# Patient Record
Sex: Male | Born: 1955 | Race: White | Hispanic: No | Marital: Single | Smoking: Current some day smoker
Health system: Southern US, Community
[De-identification: ages and names within clinical notes are randomized; demographics above are authoritative.]

## PROBLEM LIST (undated history)

## (undated) DIAGNOSIS — K219 Gastro-esophageal reflux disease without esophagitis: Secondary | ICD-10-CM

## (undated) DIAGNOSIS — E785 Hyperlipidemia, unspecified: Secondary | ICD-10-CM

## (undated) DIAGNOSIS — J449 Chronic obstructive pulmonary disease, unspecified: Secondary | ICD-10-CM

## (undated) DIAGNOSIS — N138 Other obstructive and reflux uropathy: Secondary | ICD-10-CM

## (undated) DIAGNOSIS — N401 Enlarged prostate with lower urinary tract symptoms: Secondary | ICD-10-CM

## (undated) DIAGNOSIS — N529 Male erectile dysfunction, unspecified: Secondary | ICD-10-CM

## (undated) DIAGNOSIS — M722 Plantar fascial fibromatosis: Secondary | ICD-10-CM

## (undated) DIAGNOSIS — J45909 Unspecified asthma, uncomplicated: Secondary | ICD-10-CM

## (undated) DIAGNOSIS — I10 Essential (primary) hypertension: Secondary | ICD-10-CM

## (undated) DIAGNOSIS — G8929 Other chronic pain: Secondary | ICD-10-CM

## (undated) HISTORY — PX: FOOT SURGERY: SHX648

## (undated) HISTORY — DX: Plantar fascial fibromatosis: M72.2

## (undated) HISTORY — PX: TONSILLECTOMY: SUR1361

## (undated) HISTORY — PX: TOE AMPUTATION: SHX809

## (undated) HISTORY — DX: Male erectile dysfunction, unspecified: N52.9

## (undated) HISTORY — PX: BACK SURGERY: SHX140

## (undated) HISTORY — DX: Chronic obstructive pulmonary disease, unspecified: J44.9

## (undated) HISTORY — DX: Benign prostatic hyperplasia with lower urinary tract symptoms: N40.1

## (undated) HISTORY — PX: FRACTURE SURGERY: SHX138

## (undated) HISTORY — DX: Other obstructive and reflux uropathy: N13.8

## (undated) HISTORY — DX: Unspecified asthma, uncomplicated: J45.909

## (undated) HISTORY — DX: Gastro-esophageal reflux disease without esophagitis: K21.9

## (undated) HISTORY — DX: Other chronic pain: G89.29

## (undated) HISTORY — DX: Hyperlipidemia, unspecified: E78.5

## (undated) HISTORY — DX: Essential (primary) hypertension: I10

## (undated) HISTORY — PX: SKIN GRAFT: SHX250

---

## 1984-03-28 HISTORY — PX: OTHER SURGICAL HISTORY: SHX169

## 1999-12-14 ENCOUNTER — Emergency Department (HOSPITAL_COMMUNITY): Admission: EM | Admit: 1999-12-14 | Discharge: 1999-12-14 | Payer: Self-pay | Admitting: Emergency Medicine

## 2017-10-09 ENCOUNTER — Encounter: Payer: Self-pay | Admitting: Gastroenterology

## 2017-10-09 HISTORY — PX: ESOPHAGOGASTRODUODENOSCOPY: SHX1529

## 2017-10-09 HISTORY — PX: COLONOSCOPY: SHX174

## 2017-10-23 HISTORY — PX: COLONOSCOPY: SHX174

## 2018-03-28 HISTORY — PX: COLONOSCOPY: SHX174

## 2018-06-14 ENCOUNTER — Telehealth: Payer: Self-pay | Admitting: Gastroenterology

## 2018-06-14 NOTE — Telephone Encounter (Signed)
Hi Dr. Lyndel Safe, we have received a referral from pt's PCP for abdominal pain and swelling. Pt has been seeing a GI doctor at [facility] but is looking to transfer his care over to you because he has not be very pleased with the care received. We have received his GI records and they will be sent over for your review. Please advise on scheduling. Thank you.

## 2018-06-14 NOTE — Telephone Encounter (Signed)
Will be glad to help OK to schedule in 4 weeks or longer until Covid scare is over [employer]

## 2018-06-15 ENCOUNTER — Encounter: Payer: Self-pay | Admitting: Gastroenterology

## 2018-07-09 ENCOUNTER — Encounter: Payer: Self-pay | Admitting: Gastroenterology

## 2018-07-10 ENCOUNTER — Ambulatory Visit: Payer: Self-pay | Admitting: Gastroenterology

## 2018-08-17 ENCOUNTER — Telehealth: Payer: Self-pay | Admitting: Gastroenterology

## 2018-08-17 NOTE — Telephone Encounter (Signed)
The pt called to confirm appt with Dr Lyndel Safe was a virtual visit. I did advise him of the date and time and that it is virtual The pt has been advised of the information and verbalized understanding.

## 2018-08-21 ENCOUNTER — Encounter: Payer: Self-pay | Admitting: Gastroenterology

## 2018-08-21 ENCOUNTER — Other Ambulatory Visit: Payer: Self-pay

## 2018-08-21 ENCOUNTER — Telehealth (INDEPENDENT_AMBULATORY_CARE_PROVIDER_SITE_OTHER): Payer: Medicare Other | Admitting: Gastroenterology

## 2018-08-21 VITALS — Ht 68.0 in | Wt 184.0 lb

## 2018-08-21 DIAGNOSIS — M6208 Separation of muscle (nontraumatic), other site: Secondary | ICD-10-CM

## 2018-08-21 DIAGNOSIS — R109 Unspecified abdominal pain: Secondary | ICD-10-CM | POA: Diagnosis not present

## 2018-08-21 DIAGNOSIS — K5909 Other constipation: Secondary | ICD-10-CM

## 2018-08-21 DIAGNOSIS — R140 Abdominal distension (gaseous): Secondary | ICD-10-CM

## 2018-08-21 MED ORDER — NALOXEGOL OXALATE 25 MG PO TABS: 25.0000 mg | ORAL_TABLET | Freq: Every day | ORAL | 0 refills | Status: DC

## 2018-08-21 MED ORDER — SUPREP BOWEL PREP KIT 17.5-3.13-1.6 GM/177ML PO SOLN: 1.0000 | ORAL | 0 refills | Status: DC

## 2018-08-21 NOTE — Patient Instructions (Addendum)
If you are age 63 or older, your body mass index should be between 23-30. Your Body mass index is 27.98 kg/m. If this is out of the aforementioned range listed, please consider follow up with your Primary Care Provider.  If you are age 43 or younger, your body mass index should be between 19-25. Your Body mass index is 27.98 kg/m. If this is out of the aformentioned range listed, please consider follow up with your Primary Care Provider.   We have given you samples of the following medication to take: Movantik  We have sent the following medications to your pharmacy for you to pick up at your convenience: Suprep  Please purchase the following medications over the counter and take as directed: Miralax 17 grams twice daily.  You have been scheduled [facility] [place] [address] [address]   You are scheduled on 08/24/18 at [place] should arrive 15 minutes prior to your appointment time for registration. Please follow the written instructions below on the day of your exam:  WARNING: IF YOU ARE ALLERGIC TO IODINE/X-RAY DYE, PLEASE NOTIFY RADIOLOGY IMMEDIATELY AT [phone]! YOU WILL BE GIVEN A 13 HOUR PREMEDICATION PREP.  1) Do not eat or drink anything after 5am (4 hours prior to your test) 2) You have been given 2 bottles of oral contrast to drink. The solution may taste better if refrigerated, but do NOT add ice or any other liquid to this solution. Shake well before drinking.    Drink 1 bottle of contrast @ 7am (2 hours prior to your exam)  Drink 1 bottle of contrast @ 8am (1 hour prior to your exam)  You may take any medications as prescribed with a small amount of water, if necessary. If you take any of the following medications: METFORMIN, GLUCOPHAGE, GLUCOVANCE, AVANDAMET, RIOMET, FORTAMET, Sampson MET, JANUMET, GLUMETZA or METAGLIP, you MAY be asked to HOLD this medication 48 hours AFTER the  exam.  The purpose of you drinking the oral contrast is to aid in the visualization of your intestinal tract. The contrast solution may cause some diarrhea. Depending on your individual set of symptoms, you may also receive an intravenous injection of x-ray contrast/dye. Plan on being at [facility] for 30 minutes or longer, depending on the type of exam you are having performed.  This test typically takes 30-45 minutes to complete.  If you have any questions regarding your exam or if you need to reschedule, you may call the CT department at [phone] between the hours of 8:00 am and 5:00 pm, Monday-Friday.  ________________________________________________________________________   Your provider has requested that you have lab work. Please go to [facility] in [place] ([place] to have this done.  Press "B" on the elevator. The lab is located at the first door on the left as you exit the elevator.  You have been scheduled for a colonoscopy. Please follow written instructions given to you at your visit today.  Please pick up your prep supplies at the pharmacy within the next 1-3 days. If you use inhalers (even only as needed), please bring them with you on the day of your procedure. Your physician has requested that you go to www.startemmi.com and enter the access code given to you at your visit today. This web site gives a general overview about your procedure. However, you should still follow specific instructions given to you by  our office regarding your preparation for the procedure.  To help prevent the possible spread of infection to our patients, communities, and staff; we will be implementing the following measures:  As of now we are not allowing any visitors/family members to accompany you to any upcoming appointments with [facility]. If you have any concerns about this please contact our office to discuss prior to the appointment.   Two  days before your procedure: Mix 3 packs (or capfuls) of Miralax in 48 ounces of clear liquid and drink at 6pm.   Thank you,  Dr. Jackquline Denmark

## 2018-08-21 NOTE — Progress Notes (Signed)
Chief Complaint: Abdominal pain  Referring Provider:  Raelyn Number, MD      ASSESSMENT AND PLAN;   #1. Left sided abdo pain with bloating #2. Chronic constipation (element of OIC) #3. Abn CT colography 10/2017 showing stool vs polyps ascending colon.. Had 2 attempts at colon at [facility] 10/10/2018, 10/24/2018 - poor prep, which prompted CT colography)  #4. Comorbid conditions include COPD with continued smoking, ILD, PVD, PAD, Chronic LBP on narcotics. HTN, peripheral neuropathy. #5. 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  Small B/L inguinal hernias. Eval by Sx (Dr Raul Del at [facility])  Plan: - CT abdo/pelvis ASAP. - Miralax 17g po bid (increased dose). - CBC, CMP, TSH. - Minimize pain medications as much as possible. - Increase water intake. - Movantik 25mg  po qd (samples to be given) -Therafter, colon with 2 day prep at [facility], clear liquids x 48hr prior.  I have explained the risks and benefits.  He wishes to proceed.   HPI:    Tyler Cabrera is a 63 y.o. male  With " severe abdominal pain" " I am severely blocked" in the left upper and left lower quadrant associated with abdominal bloating.  No bowel movements for the last 48 hours.  Has history of severe longstanding constipation despite of MiraLAX once a day.  Has tried multiple medications in the past.  Movantik did work but he ran out of samples.  Describes pain as colicky, 2-99/24, intermittent, severe, nonradiating.  Had extensive GI work-up at [facility] by Dr.Badreddine including EGD 10/09/2017: Negative, status post dilatation 17 mm savory.  Negative esophageal biopsies for EOE, negative small bowel biopsies for celiac.  Colonoscopy 10/09/2017: Poor preparation.  Repeat colonoscopy 10/23/2017-small colonic polyp status post polypectomy in the transverse colon, internal hemorrhoids.  Poor preparation.  CT colonography-stool versus polyps ascending colon.  CT 08/07/2017-negative.  Had surgical consultation for  possible ventral hernia.  Diagnosed with rectal diastases.  Small bilateral inguinal hernias which do not need surgical repair.  At the present time he denies having any nausea/vomiting.  Denies having any heartburn, odynophagia or dysphagia.  No significant weight loss.  Continues to require pain medications.  Has appointment with Dr. Jannette Fogo tomorrow.  Continues to smoke despite of medical advice.  No sodas, chocolates, chewing gums and candy.   Past Medical History:  Diagnosis Date  . BPH with obstruction/lower urinary tract symptoms   . GERD (gastroesophageal reflux disease)   . Hyperlipidemia   . Hypertension   . Organic impotence   . Plantar fasciitis, left     Past Surgical History:  Procedure Laterality Date  . BACK SURGERY    . COLONOSCOPY  10/09/2017   [facility]-Internal hemorrhoids. Quality of prep was poor  . COLONOSCOPY  10/23/2017   [facility]-Polyp (66mm) in the transverse colon (polypectomy). Internal hemorrhoids  . COLONOSCOPY  03/28/2018   CT Virtual Colonoscopy  . ESOPHAGOGASTRODUODENOSCOPY  10/09/2017   [facility] [place] and Z-line were at 40cm. Normal mucosa in the whole esophagus (biopsy, dilation). Normal mucosa in the whole stomach  . FOOT SURGERY Left   . FRACTURE SURGERY    . SKIN GRAFT    . TOE AMPUTATION    . TONSILLECTOMY      Family History  Problem Relation Age of Onset  . Colon cancer Neg Hx   . Esophageal cancer Neg Hx   . Crohn's disease Neg Hx     Social History   Tobacco  Use  . Smoking status: Current Some Day Smoker  Substance Use Topics  . Alcohol use: Not Currently  . Drug use: Not Currently    Comment: quit 30 years ago    Current Outpatient Medications  Medication Sig Dispense Refill  . alfuzosin (UROXATRAL) 10 MG 24 hr tablet Take 1 tablet by mouth daily.    . Aspirin (ACETYL SALICYLIC ACID) POWD Take 81 mg by mouth daily.    Marland Kitchen atorvastatin (LIPITOR) 10 MG  tablet Take 1 tablet by mouth daily.    . benazepril-hydrochlorthiazide (LOTENSIN HCT) 20-25 MG tablet Take 1 tablet by mouth daily.    . cyclobenzaprine (FLEXERIL) 10 MG tablet Take 10 mg by mouth 3 (three) times daily as needed.    Marland Kitchen esomeprazole (NEXIUM) 40 MG capsule Take 1 capsule by mouth daily.    . Folic Acid 20 MG CAPS Take 1 capsule by mouth daily.    Marland Kitchen gabapentin (NEURONTIN) 800 MG tablet Take 800 mg by mouth 4 (four) times daily.    . hydrOXYzine (ATARAX/VISTARIL) 25 MG tablet 2 tablets as needed.    Marland Kitchen KLOR-CON M20 20 MEQ tablet Take 20 mEq by mouth 2 (two) times daily.    . nicotine (NICOTROL) 10 MG inhaler Inhale 1 continuous puffing into the lungs as needed for smoking cessation.    Marland Kitchen oxyCODONE-acetaminophen (PERCOCET) 10-325 MG tablet Take 1 tablet by mouth 5 (five) times daily.    . Vitamin D, Ergocalciferol, (DRISDOL) 1.25 MG (50000 UT) CAPS capsule 1 capsule once a week.    . zolpidem (AMBIEN) 10 MG tablet Take 1 tablet by mouth daily.     No current facility-administered medications for this visit.     Allergies  Allergen Reactions  . Penicillins Anaphylaxis and Other (See Comments)    Other reaction(s): Anaphylaxis   . Meperidine Nausea And Vomiting    Other reaction(s): Nausea And Vomiting Other reaction(s): Nausea And Vomiting Other reaction(s): Other headaches     Review of Systems:  Constitutional: Denies fever, chills, diaphoresis, appetite change and fatigue.  HEENT: Denies photophobia, eye pain, redness, hearing loss, ear pain, congestion, sore throat, rhinorrhea, sneezing, mouth sores, neck pain, neck stiffness and tinnitus.   Respiratory: has SOB, DOE, cough, chest tightness,  and wheezing.   Cardiovascular: Denies chest pain, palpitations and leg swelling.  Genitourinary: Denies dysuria, urgency, has frequency, no hematuria, flank pain and has occ difficulty urinating.  Musculoskeletal: has  myalgias, back pain, joint swelling, arthralgias and gait  problem.  Skin: No rash.  Neurological: Denies dizziness, seizures, syncope, weakness, light-headedness, numbness and headaches.  Hematological: Denies adenopathy. Easy bruising, personal or family bleeding history  Psychiatric/Behavioral: has anxiety or depression     Physical Exam:    Ht 5\' 8"  (1.727 m)   Wt 184 lb (83.5 kg)   BMI 27.98 kg/m  Filed Weights   08/21/18 0904  Weight: 184 lb (83.5 kg)   Not examined since it was a tele-visit.  Data Reviewed: I have personally reviewed following labs and imaging studies  This service was provided via telemedicine.  The patient was located at home.  The provider was located in office.  The patient did consent to this telephone visit and is aware of possible charges through their insurance for this visit.  The patient was referred by Dr. Jannette Fogo.   Time spent on call/review of previous extensive records/CTs: 30 min    Carmell Austria, MD 08/21/2018, 11:16 AM  Cc: Raelyn Number, MD

## 2018-08-24 ENCOUNTER — Encounter (HOSPITAL_BASED_OUTPATIENT_CLINIC_OR_DEPARTMENT_OTHER): Payer: Self-pay

## 2018-08-24 ENCOUNTER — Ambulatory Visit (HOSPITAL_BASED_OUTPATIENT_CLINIC_OR_DEPARTMENT_OTHER)
Admission: RE | Admit: 2018-08-24 | Discharge: 2018-08-24 | Disposition: A | Discharge status: Home / Self Care | Payer: Medicare Other | Source: Ambulatory Visit | Attending: Gastroenterology | Admitting: Gastroenterology

## 2018-08-24 ENCOUNTER — Other Ambulatory Visit: Payer: Self-pay

## 2018-08-24 DIAGNOSIS — R109 Unspecified abdominal pain: Secondary | ICD-10-CM | POA: Diagnosis not present

## 2018-08-24 DIAGNOSIS — R140 Abdominal distension (gaseous): Secondary | ICD-10-CM | POA: Insufficient documentation

## 2018-08-24 DIAGNOSIS — K5909 Other constipation: Secondary | ICD-10-CM | POA: Insufficient documentation

## 2018-08-24 DIAGNOSIS — M6208 Separation of muscle (nontraumatic), other site: Secondary | ICD-10-CM | POA: Diagnosis present

## 2018-08-24 MED ORDER — IOHEXOL 300 MG/ML  SOLN
100.0000 mL | Freq: Once | INTRAMUSCULAR | Status: AC | PRN
  Administered 2018-08-24: 10:00:00 100 mL via INTRAVENOUS

## 2018-08-29 ENCOUNTER — Telehealth: Payer: Self-pay | Admitting: *Deleted

## 2018-08-29 NOTE — Telephone Encounter (Signed)

## 2018-08-30 ENCOUNTER — Telehealth: Payer: Self-pay | Admitting: Gastroenterology

## 2018-08-30 NOTE — Telephone Encounter (Signed)
Patient wants to add his aunt to his DPR. I advised him to tell the front desk at check in tomorrow and we can add her.

## 2018-08-31 ENCOUNTER — Ambulatory Visit (AMBULATORY_SURGERY_CENTER): Payer: Medicare Other | Admitting: Gastroenterology

## 2018-08-31 ENCOUNTER — Encounter: Payer: Self-pay | Admitting: Gastroenterology

## 2018-08-31 ENCOUNTER — Other Ambulatory Visit: Payer: Self-pay

## 2018-08-31 VITALS — BP 119/60 | HR 72 | Temp 97.6°F | Resp 20 | Ht 68.0 in | Wt 184.0 lb

## 2018-08-31 DIAGNOSIS — D125 Benign neoplasm of sigmoid colon: Secondary | ICD-10-CM | POA: Diagnosis not present

## 2018-08-31 DIAGNOSIS — R109 Unspecified abdominal pain: Secondary | ICD-10-CM

## 2018-08-31 DIAGNOSIS — K5730 Diverticulosis of large intestine without perforation or abscess without bleeding: Secondary | ICD-10-CM

## 2018-08-31 DIAGNOSIS — D123 Benign neoplasm of transverse colon: Secondary | ICD-10-CM

## 2018-08-31 MED ORDER — SODIUM CHLORIDE 0.9 % IV SOLN: 500.0000 mL | Freq: Once | INTRAVENOUS | Status: DC

## 2018-08-31 NOTE — Progress Notes (Signed)
To PACU, VSS. Report to RN.tb 

## 2018-08-31 NOTE — Op Note (Signed)
[name] Procedure Date: 08/31/2018 1:34 PM MRN: [id number] Endoscopist: Jackquline Denmark , MD Age: 63 Referring MD:  Date of Birth: 1955-12-03 Gender: Male Account #: [id number] Procedure:                Colonoscopy Indications:              Abnormal virtual colonoscopy, LLQ pain, chronic                            constipation Medicines:                Monitored Anesthesia Care Procedure:                Pre-Anesthesia Assessment:                           - Prior to the procedure, a History and Physical                            was performed, and patient medications and                            allergies were reviewed. The patient's tolerance of                            previous anesthesia was also reviewed. The risks                            and benefits of the procedure and the sedation                            options and risks were discussed with the patient.                            All questions were answered, and informed consent                            was obtained. Prior Anticoagulants: The patient has                            taken no previous anticoagulant or antiplatelet                            agents. ASA Grade Assessment: III - A patient with                            severe systemic disease. After reviewing the risks                            and benefits, the patient was deemed in                            satisfactory condition to undergo the procedure.  After obtaining informed consent, the colonoscope                            was passed under direct vision. Throughout the                            procedure, the patient's blood pressure, pulse, and                            oxygen saturations were monitored continuously. The                            Model CF-HQ190L [id number]) scope was introduced                            through the and advanced to the 1 cm into the                        ileum. The colonoscopy was somewhat difficult due                            to a redundant colon and a tortuous colon.                            Successful completion of the procedure was aided by                            applying abdominal pressure. The quality of the                            bowel preparation was adequate to identify polyps.                            Some retained stool and solid vegetable material                            which could not be fully washed. Overall over 95%                            of the colonic mucosa was visualized satisfactorily                            despite 2-day preparation. The examination was                            adequate. The ileocecal valve, appendiceal orifice,                            and rectum were photographed. Scope In: 2:06:13 PM Scope Out: 2:31:33 PM Scope Withdrawal Time: 0 hours 14 minutes 57 seconds  Total Procedure Duration: 0 hours 25 minutes 20 seconds  Findings:                 A single (solitary) 10 mm superficial ulcer was  found in the cecum. No bleeding was present. No                            stigmata of recent bleeding were seen. Biopsies                            were taken with a cold forceps for histology.                            Estimated blood loss: none.                           A 6 mm polyp was found in the mid transverse colon.                            The polyp was sessile. The polyp was removed with a                            cold snare. Resection and retrieval were complete.                            Estimated blood loss: none.                           A 10 mm polyp was found in the proximal sigmoid                            colon. The polyp was sessile. The polyp was removed                            with a cold snare. Resection and retrieval were                            complete. Estimated blood loss: none.                            A few small-mouthed diverticula were found in the                            sigmoid colon, transverse colon and ascending colon.                           The terminal ileum appeared normal.                           The exam was otherwise without abnormality on                            direct and retroflexion views. Complications:            No immediate complications. Estimated Blood Loss:     Estimated blood loss: none. Impression:               - A single (solitary), likely stercoral ulcer in  the cecum. Biopsied.                           - Colonic polyps s/p polypectomy.                           - Mild pancolonic diverticulosis.                           - Otherwise normal to TI. Recommendation:           - Patient has a contact number available for                            emergencies. The signs and symptoms of potential                            delayed complications were discussed with the                            patient. Return to normal activities tomorrow.                            Written discharge instructions were provided to the                            patient.                           - Resume previous diet.                           - Continue present medications.                           - Miralax 1 capful (17 grams) in 8 ounces of water                            PO BID indefinitely.                           - Minimize pain medications.                           - Repeat colonoscopy for surveillance based on                            pathology results.                           - Return to GI office in 12 weeks. Jackquline Denmark, MD 08/31/2018 2:49:50 PM This report has been signed electronically.

## 2018-08-31 NOTE — Patient Instructions (Addendum)
YOU HAD AN ENDOSCOPIC PROCEDURE TODAY AT [facility]:   Refer to the procedure report that was given to you for any specific questions about what was found during the examination.  If the procedure report does not answer your questions, please call your gastroenterologist to clarify.  If you requested that your care partner not be given the details of your procedure findings, then the procedure report has been included in a sealed envelope for you to review at your convenience later.  YOU SHOULD EXPECT: Some feelings of bloating in the abdomen. Passage of more gas than usual.  Walking can help get rid of the air that was put into your GI tract during the procedure and reduce the bloating. If you had a lower endoscopy (such as a colonoscopy or flexible sigmoidoscopy) you may notice spotting of blood in your stool or on the toilet paper. If you underwent a bowel prep for your procedure, you may not have a normal bowel movement for a few days.  Please Note:  You might notice some irritation and congestion in your nose or some drainage.  This is from the oxygen used during your procedure.  There is no need for concern and it should clear up in a day or so.  SYMPTOMS TO REPORT IMMEDIATELY:   Following lower endoscopy (colonoscopy or flexible sigmoidoscopy):  Excessive amounts of blood in the stool  Significant tenderness or worsening of abdominal pains  Swelling of the abdomen that is new, acute  Fever of 100F or higher   For urgent or emergent issues, a gastroenterologist can be reached at any hour by calling [phone].   DIET:  We do recommend a small meal at first, but then you may proceed to your regular diet.  Drink plenty of fluids but you should avoid alcoholic beverages for 24 hours.  MEDICATIONS: Continue present medications. Use Mirilax 1 capful (17 grams) in 8 ounces of water by mouth twice daily indefinitely. Minimize pain medications.  Follow Up: Follow up with  Dr. Lyndel Safe in his office for an appointment in 12 weeks.  Please see handouts given to you by your recovery nurse.  ACTIVITY:  You should plan to take it easy for the rest of today and you should NOT DRIVE or use heavy machinery until tomorrow (because of the sedation medicines used during the test).    FOLLOW UP: Our staff will call the number listed on your records 48-72 hours following your procedure to check on you and address any questions or concerns that you may have regarding the information given to you following your procedure. If we do not reach you, we will leave a message.  We will attempt to reach you two times.  During this call, we will ask if you have developed any symptoms of COVID 19. If you develop any symptoms (ie: fever, flu-like symptoms, shortness of breath, cough etc.) before then, please call [phone].  If you test positive for Covid 19 in the 2 weeks post procedure, please call and report this information to [place].    If any biopsies were taken you will be contacted by phone or by letter within the next 1-3 weeks.  Please call us at [phone] if you have not heard about the biopsies in 3 weeks.   Thank you for allowing [place] to provide for your healthcare needs today.   SIGNATURES/CONFIDENTIALITY: You and/or your care partner have signed paperwork which will be entered into your electronic medical record.  These signatures attest to the fact that that the information above on your After Visit Summary has been reviewed and is understood.  Full responsibility of the confidentiality of this discharge information lies with you and/or your care-partner.

## 2018-09-04 ENCOUNTER — Telehealth: Payer: Self-pay | Admitting: *Deleted

## 2018-09-04 NOTE — Telephone Encounter (Signed)
  Follow up Call-  Call back number 08/31/2018  Post procedure Call Back phone  # [phone]  Permission to leave phone message Yes  Some recent data might be hidden     Patient questions:  Do you have a fever, pain , or abdominal swelling? No. Pain Score  0 *  Have you tolerated food without any problems? Yes.  Patient stated he hasn't had a lot to eat but that was normal for him.   Have you been able to return to your normal activities? Yes.    Do you have any questions about your discharge instructions: Diet   No. Medications  No. Follow up visit  No.  Do you have questions or concerns about your Care? No.  Actions: * If pain score is 4 or above: No action needed, pain <4.  1. Have you developed a fever since your procedure? NO  2.   Have you had an respiratory symptoms (SOB or cough) since your procedure? NO  3.   Have you tested positive for COVID 19 since your procedure NO  4.   Have you had any family members/close contacts diagnosed with the COVID 19 since your procedure?  NO   If yes to any of these questions please route to Joylene John, RN and Alphonsa Gin, RN.

## 2018-09-05 ENCOUNTER — Encounter: Payer: Self-pay | Admitting: Gastroenterology

## 2020-01-15 IMAGING — CT CT ABDOMEN AND PELVIS WITH CONTRAST
2 of 5 series · 16 of 46 positions shown, 18 images · IV contrast (APPLIED)
Comparison: CT the abdomen and pelvis 04/03/2018.

CLINICAL DATA: 62-year-old male with history of left-sided
abdominal pain with distention. Pain worsening over the past several
months. Constipation.

EXAM:
CT ABDOMEN AND PELVIS WITH CONTRAST
TECHNIQUE: Multidetector CT imaging of the abdomen and pelvis was performed
using the standard protocol following bolus administration of
intravenous contrast.
CONTRAST:  100mL OMNIPAQUE IOHEXOL 300 MG/ML  SOLN

[Series 2: axial st · axial · 0.83mm/px · z∈[-492,-42]mm · 13 of 102 slices shown, 15 images]
[im 6/102  soft-tissue]
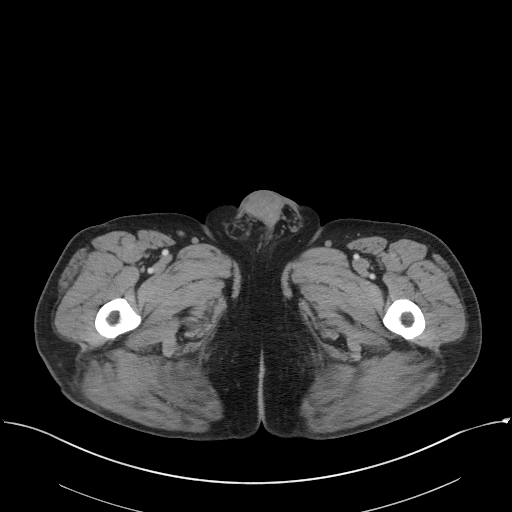
[im 6/102  bone]
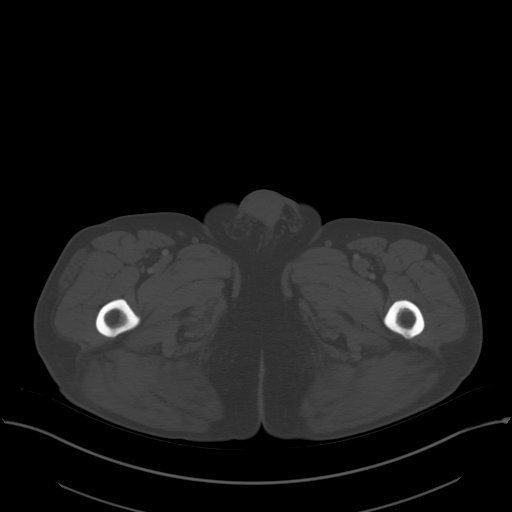
[im 12/102  soft-tissue]
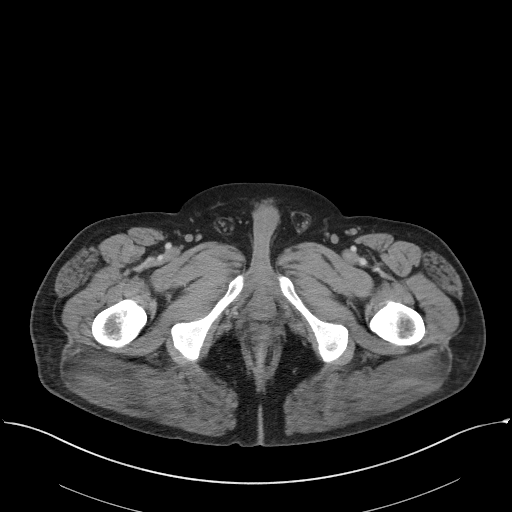
[im 23/102  soft-tissue]
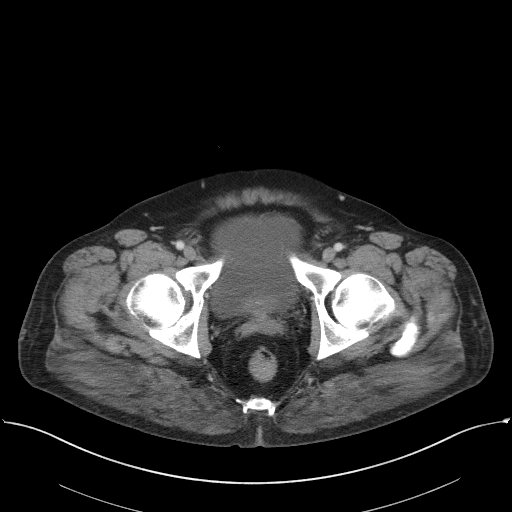
[im 29/102  soft-tissue]
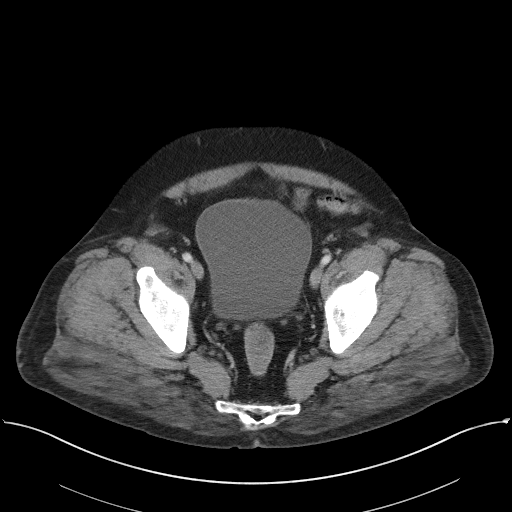
[im 34/102  soft-tissue]
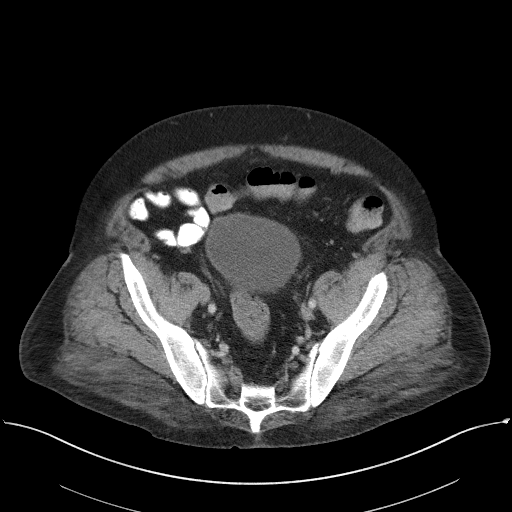
[im 45/102  soft-tissue]
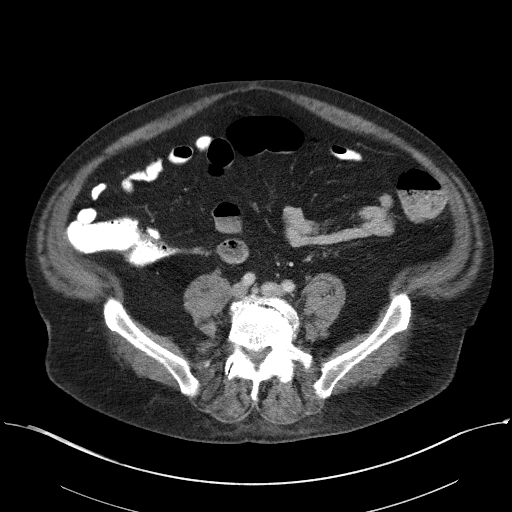
[im 51/102  soft-tissue]
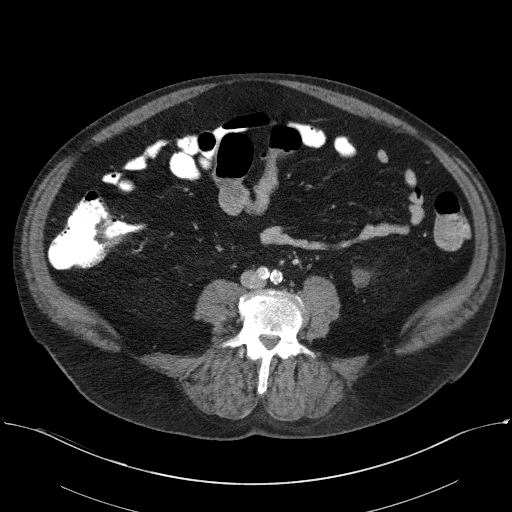
[im 57/102  soft-tissue]
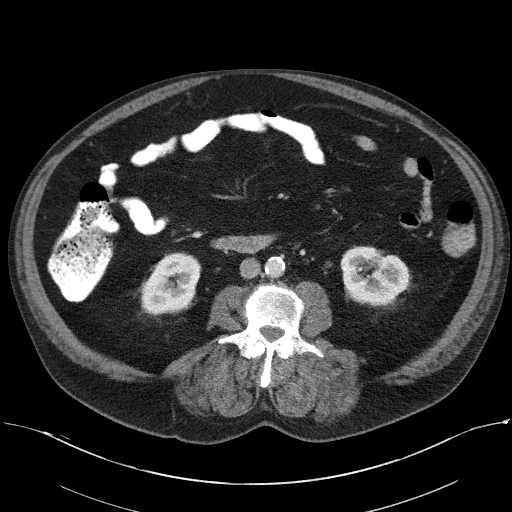
[im 68/102  soft-tissue]
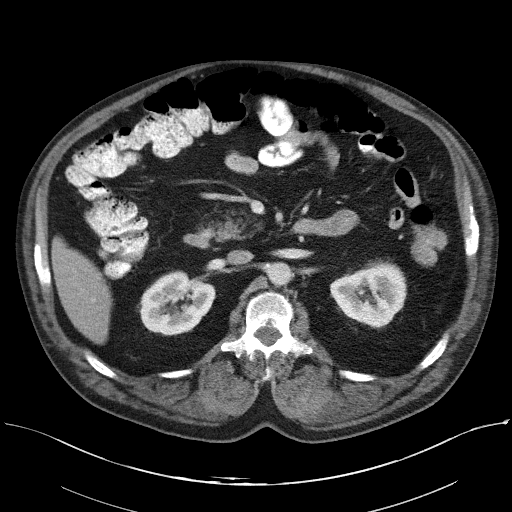
[im 68/102  bone]
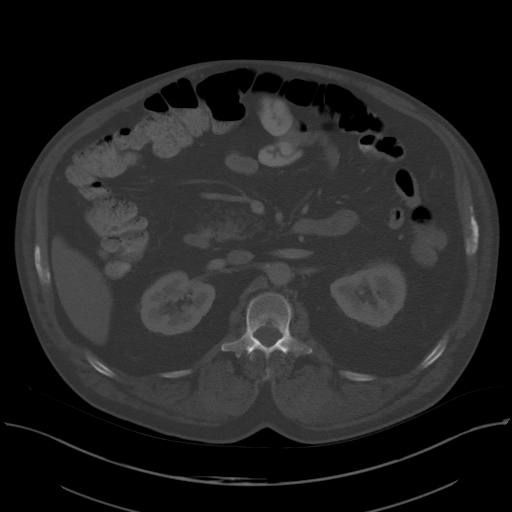
[im 73/102  soft-tissue]
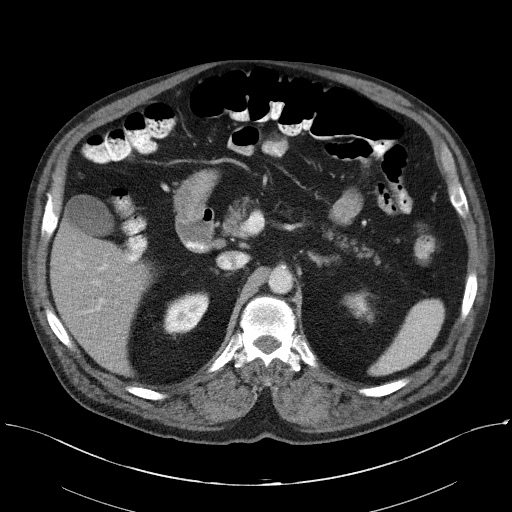
[im 79/102  soft-tissue]
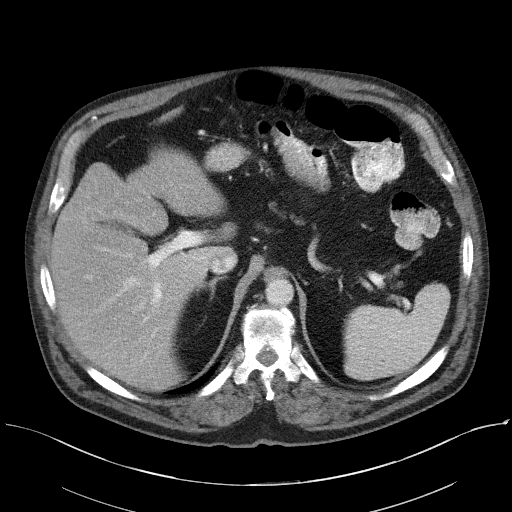
[im 90/102  soft-tissue]
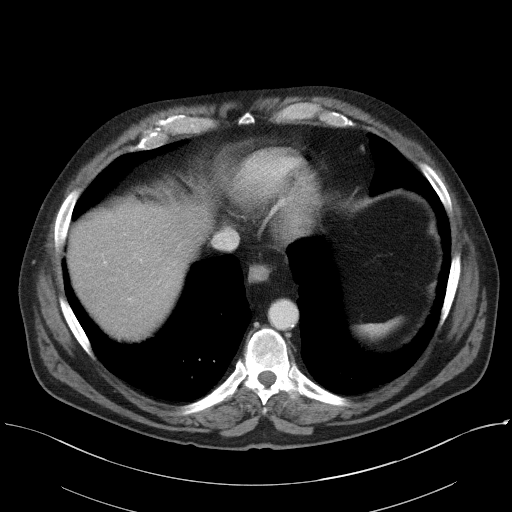
[im 96/102  soft-tissue]
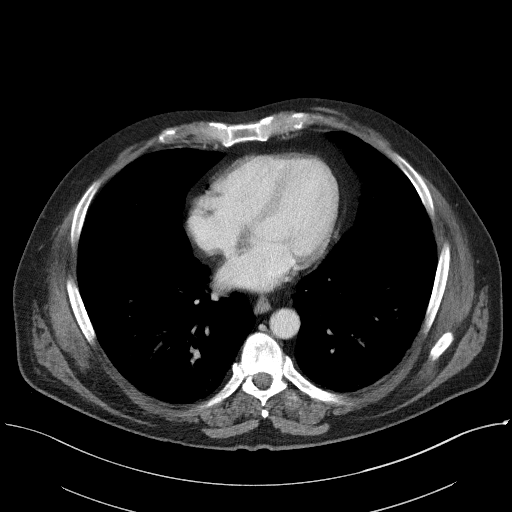

[Series 5: coronal st · coronal · 1.07mm/px · 3 of 109 slices shown]
[im 37/109  soft-tissue]
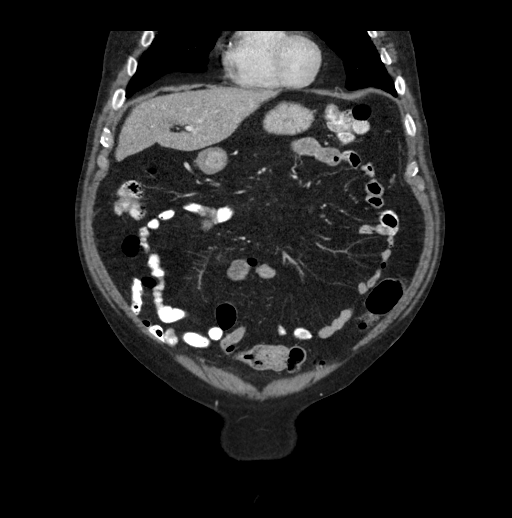
[im 49/109  soft-tissue]
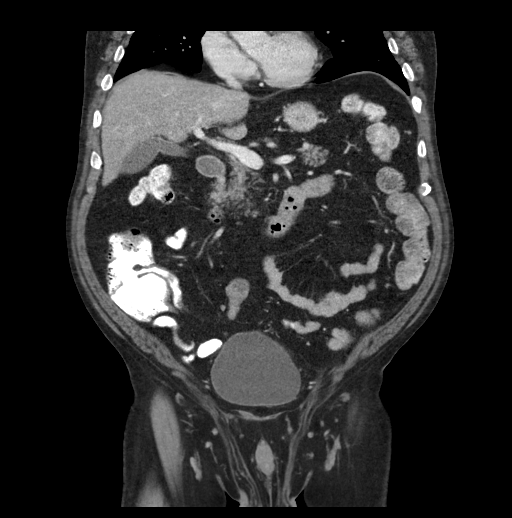
[im 61/109  soft-tissue]
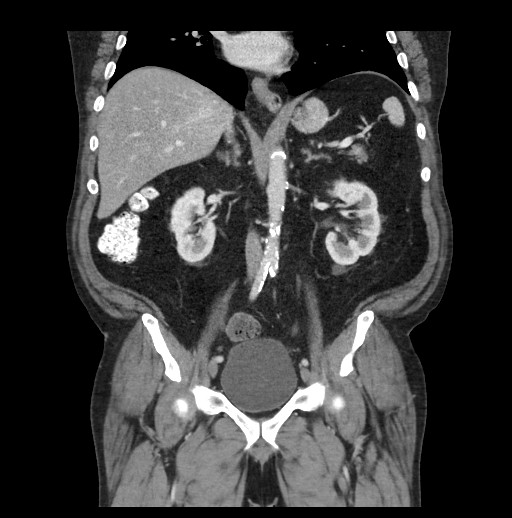

[16 of 46 positions shown; findings below may reference images not displayed]

FINDINGS: Lower chest: Emphysematous changes in the lung bases bilaterally.
Atherosclerotic calcifications in the descending thoracic aorta as
well as the left circumflex and right coronary arteries.
Calcifications of the mitral annulus.

Hepatobiliary: No suspicious cystic or solid hepatic lesions. No
intra or extrahepatic biliary ductal dilatation. Gallbladder is
normal in appearance.

Pancreas: No pancreatic mass. No pancreatic ductal dilatation. No
pancreatic or peripancreatic fluid or inflammatory changes.

Spleen: Unremarkable.

Adrenals/Urinary Tract: Exophytic low-attenuation lesion in the
lower pole of the left kidney, compatible with a simple cysts,
measuring 1.5 cm. Right kidney and bilateral adrenal glands are
normal in appearance. No hydroureteronephrosis. Urinary bladder is
normal in appearance.

Stomach/Bowel: Normal appearance of the stomach. No pathologic
dilatation of small bowel or colon. Normal appendix.

Vascular/Lymphatic: Aortic atherosclerosis, without evidence of
aneurysm or dissection in the abdominal or pelvic vasculature. No
lymphadenopathy noted in the abdomen or pelvis.

Reproductive: Prostate gland and seminal vesicles are unremarkable
in appearance.

Other: No significant volume of ascites.  No pneumoperitoneum.

Musculoskeletal: There are no aggressive appearing lytic or blastic
lesions noted in the visualized portions of the skeleton.
IMPRESSION: 1. No acute findings are noted in the abdomen or pelvis to account
for the patient's symptoms.
2. Normal appendix.
3. Emphysema.
4. Aortic atherosclerosis, in addition to 2 vessel coronary artery
disease. Please note that although the presence of coronary artery
calcium documents the presence of coronary artery disease, the
severity of this disease and any potential stenosis cannot be
assessed on this non-gated CT examination. Assessment for potential
risk factor modification, dietary therapy or pharmacologic therapy
may be warranted, if clinically indicated.
5. Additional incidental findings, as above.

## 2020-08-06 ENCOUNTER — Ambulatory Visit: Payer: Medicare Other | Admitting: Neurology

## 2020-11-05 ENCOUNTER — Encounter: Payer: Self-pay | Admitting: Neurology

## 2020-11-05 ENCOUNTER — Ambulatory Visit: Payer: Medicare Other | Admitting: Neurology

## 2020-11-05 ENCOUNTER — Encounter: Payer: Self-pay | Admitting: *Deleted

## 2022-06-23 ENCOUNTER — Encounter: Payer: Self-pay | Admitting: Internal Medicine

## 2022-11-30 ENCOUNTER — Encounter: Payer: Self-pay | Admitting: Gastroenterology

## 2022-11-30 ENCOUNTER — Telehealth: Payer: Self-pay | Admitting: Gastroenterology

## 2023-03-07 ENCOUNTER — Ambulatory Visit: Payer: 59 | Admitting: Gastroenterology

## 2023-07-26 ENCOUNTER — Encounter: Payer: Self-pay | Admitting: Gastroenterology

## 2023-08-07 ENCOUNTER — Emergency Department (HOSPITAL_COMMUNITY)

## 2023-08-07 ENCOUNTER — Other Ambulatory Visit: Payer: Self-pay

## 2023-08-07 ENCOUNTER — Emergency Department (HOSPITAL_COMMUNITY)
Admission: EM | Admit: 2023-08-07 | Discharge: 2023-08-08 | Disposition: A | Discharge status: Home / Self Care | Attending: Emergency Medicine | Admitting: Emergency Medicine

## 2023-08-07 ENCOUNTER — Telehealth: Payer: Self-pay | Admitting: Gastroenterology

## 2023-08-07 DIAGNOSIS — R1084 Generalized abdominal pain: Secondary | ICD-10-CM | POA: Diagnosis present

## 2023-08-07 DIAGNOSIS — I10 Essential (primary) hypertension: Secondary | ICD-10-CM | POA: Diagnosis not present

## 2023-08-07 DIAGNOSIS — J45909 Unspecified asthma, uncomplicated: Secondary | ICD-10-CM | POA: Insufficient documentation

## 2023-08-07 LAB — COMPREHENSIVE METABOLIC PANEL WITH GFR
ALT: 16 U/L (ref 0–44)
AST: 17 U/L (ref 15–41)
Albumin: 3.8 g/dL (ref 3.5–5.0)
Alkaline Phosphatase: 62 U/L (ref 38–126)
Anion gap: 11 (ref 5–15)
BUN: 9 mg/dL (ref 8–23)
CO2: 25 mmol/L (ref 22–32)
Calcium: 8.8 mg/dL — ABNORMAL LOW (ref 8.9–10.3)
Chloride: 101 mmol/L (ref 98–111)
Creatinine, Ser: 0.69 mg/dL (ref 0.61–1.24)
GFR, Estimated: >60 mL/min (ref >60)
Glucose, Bld: 113 mg/dL — ABNORMAL HIGH (ref 70–99)
Potassium: 3.3 mmol/L — ABNORMAL LOW (ref 3.5–5.1)
Sodium: 137 mmol/L (ref 135–145)
Total Bilirubin: 0.3 mg/dL (ref 0.0–1.2)
Total Protein: 6.6 g/dL (ref 6.5–8.1)

## 2023-08-07 LAB — CBC WITH DIFFERENTIAL/PLATELET
Abs Immature Granulocytes: 0.02 10*3/uL (ref 0.00–0.07)
Basophils Absolute: 0 10*3/uL (ref 0.0–0.1)
Basophils Relative: 0 %
Eosinophils Absolute: 0.2 10*3/uL (ref 0.0–0.5)
Eosinophils Relative: 3 %
HCT: 41.9 % (ref 39.0–52.0)
Hemoglobin: 13.9 g/dL (ref 13.0–17.0)
Immature Granulocytes: 0 %
Lymphocytes Relative: 19 %
Lymphs Abs: 1.6 10*3/uL (ref 0.7–4.0)
MCH: 31.3 pg (ref 26.0–34.0)
MCHC: 33.2 g/dL (ref 30.0–36.0)
MCV: 94.4 fL (ref 80.0–100.0)
Monocytes Absolute: 0.8 10*3/uL (ref 0.1–1.0)
Monocytes Relative: 9 %
Neutro Abs: 5.9 10*3/uL (ref 1.7–7.7)
Neutrophils Relative %: 69 %
Platelets: 324 10*3/uL (ref 150–400)
RBC: 4.44 MIL/uL (ref 4.22–5.81)
RDW: 14 % (ref 11.5–15.5)
WBC: 8.6 10*3/uL (ref 4.0–10.5)
nRBC: 0 % (ref 0.0–0.2)

## 2023-08-07 LAB — LIPASE, BLOOD: Lipase: 24 U/L (ref 11–51)

## 2023-08-07 MED ORDER — MORPHINE SULFATE (PF) 4 MG/ML IV SOLN
4.0000 mg | Freq: Once | INTRAVENOUS | Status: AC
  Administered 2023-08-08: 4 mg via INTRAVENOUS
  Filled 2023-08-07: qty 1

## 2023-08-07 MED ORDER — LIDOCAINE 5 % EX PTCH
1.0000 | MEDICATED_PATCH | CUTANEOUS | Status: DC
  Administered 2023-08-08: 1 via TRANSDERMAL
  Filled 2023-08-07: qty 1

## 2023-08-07 MED ORDER — SODIUM CHLORIDE 0.9 % IV BOLUS
500.0000 mL | Freq: Once | INTRAVENOUS | Status: AC
  Administered 2023-08-08: 500 mL via INTRAVENOUS

## 2023-08-07 MED ORDER — ONDANSETRON HCL 4 MG/2ML IJ SOLN
4.0000 mg | Freq: Once | INTRAMUSCULAR | Status: AC
  Administered 2023-08-08: 4 mg via INTRAVENOUS
  Filled 2023-08-07: qty 2

## 2023-08-07 MED ORDER — IOHEXOL 300 MG/ML  SOLN
100.0000 mL | Freq: Once | INTRAMUSCULAR | Status: AC | PRN
Start: 2023-08-07 — End: 2023-08-07
  Administered 2023-08-07: 100 mL via INTRAVENOUS

## 2023-08-07 NOTE — ED Provider Triage Note (Signed)
 Emergency Medicine Provider Triage Evaluation Note  Tyler Cabrera , a 68 y.o. male  was evaluated in triage.  Pt complains of here for abdominal pain.  Ongoing over the last month, worse over the last few days.  Diffuse to anterior abdomen wraps around his bilateral flanks.  No nausea, vomiting, loose stool.  No blood in stool.  Urinating without difficulty.  Feels generalized fatigue.  Follows with Dr. Venice Gillis with GI who recommend coming here for eval  Review of Systems  Positive: Abd pain Negative: Fever, N/V/D  Physical Exam  BP (!) 148/68   Pulse 92   Temp 97.9 F (36.6 C) (Oral)   Resp 18   SpO2 99%  Gen:   Awake, no distress   Resp:  Normal effort  MSK:   Moves extremities without difficulty  ABD:  Diffuse tenderness Other:    Medical Decision Making  Medically screening exam initiated at 5:22 PM.  Appropriate orders placed.  ANCE REDIC was informed that the remainder of the evaluation will be completed by another provider, this initial triage assessment does not replace that evaluation, and the importance of remaining in the ED until their evaluation is complete.  Abd pain   Ishi Danser A, PA-C 08/07/23 1723

## 2023-08-07 NOTE — Telephone Encounter (Signed)
 Pt stated that he has been having ongoing stomach pain for several weeks now that is getting worse and states that he is in terrible pain today. Pt stated that he feels that he is short of breath due to the abdominal pain and abdominal bloating. Last BM yesterday. BM were formed. Pt stated that he is constantly nauseated now, no emeses. Pt was notified with his current symptoms and distress the recommendations were to proceed to the ED for evaluation and treatment.  Pt was notified that typically we recommend [facility]. Pt verbalized understanding with all questions answered.

## 2023-08-07 NOTE — Telephone Encounter (Signed)
 Inbound call from patient's mother stating patient's is very bloated and very "sick". Requesting a call to discuss further and to discuss bringing patient to the hospital. Patient requested for us  to send an ambulance to their house, advised patient she would have to call 911 for patient to be brought to the hospital. Patient's mother stated she is going to have son brought to the hospital. Requesting to be seen with Dr. Venice Gillis while in the hospital. Requesting a urgent call back. Please advise, thank you.

## 2023-08-07 NOTE — ED Provider Notes (Signed)
 Tyler Cabrera Provider Note   CSN: [id number] Arrival date & time: 08/07/23  1646     History {Add pertinent medical, surgical, social history, OB history to HPI:1} Chief Complaint  Patient presents with   Abdominal Pain    Pt BIB EMS today for abd pain ongoing for 6 weeks sent here GI doc for distension and abd pain that radiates to back. VSS, sob reported    Tyler Cabrera is a 68 y.o. male.  The history is provided by the patient and medical records.  Abdominal Pain Tyler Cabrera is a 68 y.o. male who presents to the Emergency Department complaining of *** Left sided abdominal for 1.5 months. Sharp. Goes to both sides. Constant. Can't sleep or move. Sweats at night but fever.  Has cough and sob for one month. No leg swelling. Has nausea. No vomiting. No diarrhea. Has constipation. Has dysuria.  Supposed to see gupta for christmas. 4-5 months or a year Eating less.   Hx/o neuropathy, HTN. Has chronic pain - previously on percocet now on oxy 15.   Miralax and exlax    Home Medications Prior to Admission medications   Medication Sig Start Date End Date Taking? Authorizing Provider  albuterol (VENTOLIN HFA) 108 (90 Base) MCG/ACT inhaler INHALE 1 PUFF EVERY 4 HOURS AS NEEDED 09/09/20   [provider]  alfuzosin (UROXATRAL) 10 MG 24 hr tablet Take 1 tablet by mouth daily. 02/03/16   [provider]  Aspirin (ACETYL SALICYLIC ACID) POWD Take 81 mg by mouth daily.    [provider]  atorvastatin (LIPITOR) 10 MG tablet Take 1 tablet by mouth daily. 09/25/11   [provider]  benazepril-hydrochlorthiazide (LOTENSIN HCT) 20-25 MG tablet Take 1 tablet by mouth daily. 07/26/18   [provider]  cyclobenzaprine (FLEXERIL) 10 MG tablet Take 10 mg by mouth 3 (three) times daily as needed. 08/06/18   [provider]  esomeprazole (NEXIUM) 40 MG capsule Take 1 capsule by mouth daily. 09/05/11    [provider]  Folic Acid 20 MG CAPS Take 1 capsule by mouth daily.    [provider]  gabapentin (NEURONTIN) 800 MG tablet Take 800 mg by mouth 4 (four) times daily. 08/10/18   [provider]  hydrOXYzine (ATARAX/VISTARIL) 25 MG tablet 2 tablets as needed. 09/16/17   [provider]  KLOR-CON M20 20 MEQ tablet Take 20 mEq by mouth 2 (two) times daily. 06/12/18   [provider]  naloxegol  oxalate (MOVANTIK ) 25 MG TABS tablet Take 1 tablet (25 mg total) by mouth daily. 08/21/18   Lajuan Pila, MD  nicotine (NICOTROL) 10 MG inhaler Inhale 1 continuous puffing into the lungs as needed for smoking cessation.    [provider]  oxyCODONE-acetaminophen (PERCOCET) 10-325 MG tablet Take 1 tablet by mouth 5 (five) times daily. 08/31/11   [provider]  SPIRIVA HANDIHALER 18 MCG inhalation capsule 1 capsule daily. 10/11/20   [provider]  SUPREP BOWEL PREP  KIT 17.5-3.13-1.6 GM/177ML SOLN Take 1 kit by mouth as directed. 08/21/18   Lajuan Pila, MD  SYMBICORT 160-4.5 MCG/ACT inhaler SMARTSIG:2 Puff(s) By Mouth Twice Daily 09/30/20   [provider]  Vitamin D, Ergocalciferol, (DRISDOL) 1.25 MG (50000 UT) CAPS capsule 1 capsule once a week. 04/09/16   [provider]  zolpidem (AMBIEN) 10 MG tablet Take 1 tablet by mouth daily. 09/25/11   [provider]      Allergies  Penicillins, Demerol  [meperidine hcl], Meperidine, Cymbalta [duloxetine hcl], Nifedipine, and Nsaids    Review of Systems   Review of Systems  Gastrointestinal:  Positive for abdominal pain.  All other systems reviewed and are negative.   Physical Exam Updated Vital Signs BP 119/82 (BP Location: Right Arm)   Pulse (!) 51   Temp 98 F (36.7 C) (Oral)   Resp 18   SpO2 100%  Physical Exam Vitals and nursing note reviewed.  Constitutional:      Appearance: He is well-developed.  HENT:     Head: Normocephalic and atraumatic.   Cardiovascular:     Rate and Rhythm: Normal rate and regular rhythm.  Pulmonary:     Effort: Pulmonary effort is normal. No respiratory distress.  Abdominal:     Palpations: Abdomen is soft.     Tenderness: There is no guarding or rebound.     Comments: Mild generalized tenderness, greatest on the left abd and flank.   Musculoskeletal:        General: No swelling or tenderness.     Comments: 2+ DP pulses bilaterally  Skin:    General: Skin is warm and dry.  Neurological:     Mental Status: He is alert and oriented to person, place, and time.  Psychiatric:        Behavior: Behavior normal.     ED Results / Procedures / Treatments   Labs (all labs ordered are listed, but only abnormal results are displayed) Labs Reviewed  COMPREHENSIVE METABOLIC PANEL WITH GFR - Abnormal; Notable for the following components:      Result Value   Potassium 3.3 (*)    Glucose, Bld 113 (*)    Calcium 8.8 (*)    All other components within normal limits  CBC WITH DIFFERENTIAL/PLATELET  LIPASE, BLOOD  URINALYSIS, W/ REFLEX TO CULTURE (INFECTION SUSPECTED)    EKG None  Radiology CT ABDOMEN PELVIS W CONTRAST Result Date: 08/07/2023 CLINICAL DATA:  Abdomen pain EXAM: CT ABDOMEN AND PELVIS WITH CONTRAST TECHNIQUE: Multidetector CT imaging of the abdomen and pelvis was performed using the standard protocol following bolus administration of intravenous contrast. RADIATION DOSE REDUCTION: This exam was performed according to the departmental dose-optimization program which includes automated exposure control, adjustment of the mA and/or kV according to patient size and/or use of iterative reconstruction technique. CONTRAST:  OMNIPAQUE  IOHEXOL  300 MG/ML  SOLN COMPARISON:  CT 08/24/2018, 01/30/2023 FINDINGS: Lower chest: Lung bases demonstrate no consolidation or effusion. Chronic lung disease with fibrosis and mild ground-glass densities. Hepatobiliary: Contracted gallbladder. No calcified stone. No  biliary dilatation Pancreas: Unremarkable. No pancreatic ductal dilatation or surrounding inflammatory changes. Spleen: Vague slightly geographic hypodensity within the anterior and central spleen, series 2, image 11, measures about 4.8 cm, suspect probably related to phase of enhancement. Adrenals/Urinary Tract: Adrenal glands are normal. Kidneys show no hydronephrosis. Small cyst lower pole left kidney for which no imaging follow-up is recommended. Stomach/Bowel: Stomach nonenlarged. No dilated small bowel. No acute bowel wall thickening Vascular/Lymphatic: Aortic atherosclerosis. No enlarged abdominal or pelvic lymph nodes. Reproductive: Prostate is unremarkable. Other: Negative for pelvic effusion or free air Musculoskeletal: Artifact from right hip replacement. No acute osseous abnormality IMPRESSION: 1. No CT evidence for acute intra-abdominal or pelvic abnormality. 2. Findings suspect for chronic interstitial lung disease with fibrosis and mild ground-glass densities at the lung bases. Follow-up nonemergent high-resolution chest CT may be obtained if not already performed Aortic Atherosclerosis (ICD10-I70.0). Electronically Signed   By: Ulyess Gammons.D.  On: 08/07/2023 21:00    Procedures Procedures  {Document cardiac monitor, telemetry assessment procedure when appropriate:1}  Medications Ordered in ED Medications  iohexol  (OMNIPAQUE ) 300 MG/ML solution 100 mL (100 mLs Intravenous Contrast Given 08/07/23 2036)    ED Course/ Medical Decision Making/ A&P   {   Click here for ABCD2, HEART and other calculatorsREFRESH Note before signing :1}                              Medical Decision Making  ***  {Document critical care time when appropriate:1} {Document review of labs and clinical decision tools ie heart score, Chads2Vasc2 etc:1}  {Document your independent review of radiology images, and any outside records:1} {Document your discussion with family members, caretakers, and with  consultants:1} {Document social determinants of health affecting pt's care:1} {Document your decision making why or why not admission, treatments were needed:1} Final Clinical Impression(s) / ED Diagnoses Final diagnoses:  None    Rx / DC Orders ED Discharge Orders     None

## 2023-08-08 LAB — URINALYSIS, W/ REFLEX TO CULTURE (INFECTION SUSPECTED)
Bacteria, UA: NONE SEEN
Bilirubin Urine: NEGATIVE
Glucose, UA: NEGATIVE mg/dL
Ketones, ur: NEGATIVE mg/dL
Leukocytes,Ua: NEGATIVE
Nitrite: NEGATIVE
Protein, ur: NEGATIVE mg/dL
Specific Gravity, Urine: >1.046 — ABNORMAL HIGH (ref 1.005–1.030)
pH: 5 (ref 5.0–8.0)

## 2023-08-08 MED ORDER — ONDANSETRON 4 MG PO TBDP: 4.0000 mg | ORAL_TABLET | Freq: Three times a day (TID) | ORAL | 0 refills | Status: AC | PRN

## 2023-08-08 NOTE — ED Notes (Signed)
 Pt able to eat and drink with no complaints of nausea or increased pain.

## 2023-08-08 NOTE — ED Notes (Signed)
 PO challenge started ... Water and saltine crackers given

## 2023-08-08 NOTE — Discharge Instructions (Signed)
 You had a CT scan of your abdomen performed today that showed changes to your lungs that are seen with lung disease.  Please follow up with your family doctor for further evaluation.

## 2023-09-21 ENCOUNTER — Other Ambulatory Visit: Payer: Self-pay

## 2023-09-21 ENCOUNTER — Ambulatory Visit: Admitting: Gastroenterology

## 2023-09-21 NOTE — Progress Notes (Deleted)
 Tyler Cabrera

## 2023-09-28 ENCOUNTER — Ambulatory Visit: Admitting: Physician Assistant
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
## 2023-11-22 ENCOUNTER — Ambulatory Visit: Admitting: Physician Assistant

## 2023-11-22 ENCOUNTER — Other Ambulatory Visit (INDEPENDENT_AMBULATORY_CARE_PROVIDER_SITE_OTHER)

## 2023-11-22 ENCOUNTER — Encounter: Payer: Self-pay | Admitting: Physician Assistant

## 2023-11-22 VITALS — BP 140/70 | HR 88 | Ht 63.5 in | Wt 167.5 lb

## 2023-11-22 DIAGNOSIS — J439 Emphysema, unspecified: Secondary | ICD-10-CM

## 2023-11-22 DIAGNOSIS — K5903 Drug induced constipation: Secondary | ICD-10-CM

## 2023-11-22 DIAGNOSIS — R110 Nausea: Secondary | ICD-10-CM | POA: Diagnosis not present

## 2023-11-22 DIAGNOSIS — G894 Chronic pain syndrome: Secondary | ICD-10-CM

## 2023-11-22 DIAGNOSIS — Z860100 Personal history of colon polyps, unspecified: Secondary | ICD-10-CM | POA: Diagnosis not present

## 2023-11-22 DIAGNOSIS — R1012 Left upper quadrant pain: Secondary | ICD-10-CM

## 2023-11-22 DIAGNOSIS — J449 Chronic obstructive pulmonary disease, unspecified: Secondary | ICD-10-CM | POA: Diagnosis not present

## 2023-11-22 LAB — CBC WITH DIFFERENTIAL/PLATELET
Basophils Absolute: 0.1 K/uL (ref 0.0–0.1)
Basophils Relative: 1.2 % (ref 0.0–3.0)
Eosinophils Absolute: 0.2 K/uL (ref 0.0–0.7)
Eosinophils Relative: 1.9 % (ref 0.0–5.0)
HCT: 45.3 % (ref 39.0–52.0)
Hemoglobin: 15.2 g/dL (ref 13.0–17.0)
Lymphocytes Relative: 13.4 % (ref 12.0–46.0)
Lymphs Abs: 1.1 K/uL (ref 0.7–4.0)
MCHC: 33.5 g/dL (ref 30.0–36.0)
MCV: 93.4 fl (ref 78.0–100.0)
Monocytes Absolute: 0.6 K/uL (ref 0.1–1.0)
Monocytes Relative: 7.4 % (ref 3.0–12.0)
Neutro Abs: 6.3 K/uL (ref 1.4–7.7)
Neutrophils Relative %: 76.1 % (ref 43.0–77.0)
Platelets: 351 K/uL (ref 150.0–400.0)
RBC: 4.85 Mil/uL (ref 4.22–5.81)
RDW: 13.7 % (ref 11.5–15.5)
WBC: 8.2 K/uL (ref 4.0–10.5)

## 2023-11-22 LAB — COMPREHENSIVE METABOLIC PANEL WITH GFR
ALT: 19 U/L (ref 0–53)
AST: 15 U/L (ref 0–37)
Albumin: 4.4 g/dL (ref 3.5–5.2)
Alkaline Phosphatase: 65 U/L (ref 39–117)
BUN: 11 mg/dL (ref 6–23)
CO2: 30 meq/L (ref 19–32)
Calcium: 9.3 mg/dL (ref 8.4–10.5)
Chloride: 94 meq/L — ABNORMAL LOW (ref 96–112)
Creatinine, Ser: 0.82 mg/dL (ref 0.40–1.50)
GFR: 90.53 mL/min (ref >60.00)
Glucose, Bld: 95 mg/dL (ref 70–99)
Potassium: 3.9 meq/L (ref 3.5–5.1)
Sodium: 135 meq/L (ref 135–145)
Total Bilirubin: 0.5 mg/dL (ref 0.2–1.2)
Total Protein: 7.4 g/dL (ref 6.0–8.3)

## 2023-11-22 LAB — C-REACTIVE PROTEIN: CRP: <1 mg/dL (ref 0.5–20.0)

## 2023-11-22 LAB — SEDIMENTATION RATE: Sed Rate: 18 mm/h (ref 0–20)

## 2023-11-22 MED ORDER — DICYCLOMINE HCL 10 MG PO CAPS: 10.0000 mg | ORAL_CAPSULE | Freq: Three times a day (TID) | ORAL | 0 refills | Status: AC

## 2023-11-22 MED ORDER — NA SULFATE-K SULFATE-MG SULF 17.5-3.13-1.6 GM/177ML PO SOLN: 1.0000 | Freq: Once | ORAL | 0 refills | Status: AC

## 2023-11-22 MED ORDER — LUBIPROSTONE 24 MCG PO CAPS: 24.0000 ug | ORAL_CAPSULE | Freq: Two times a day (BID) | ORAL | 0 refills | Status: AC
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
## 2023-11-22 NOTE — Patient Instructions (Addendum)
 Your provider has requested that you go to the basement level for lab work before leaving today. Press B on the elevator. The lab is located at the first door on the left as you exit the elevator.  Due to recent changes in healthcare laws, you may see the results of your imaging and laboratory studies on MyChart before your provider has had a chance to review them.  We understand that in some cases there may be results that are confusing or concerning to you. Not all laboratory results come back in the same time frame and the provider may be waiting for multiple results in order to interpret others.  Please give us  48 hours in order for your provider to thoroughly review all the results before contacting the office for clarification of your results.   Your provider has requested that you have an abdominal x ray before leaving today. Please go to the basement floor to our Radiology department for the test.   You have been scheduled for an abdominal ultrasound at [facility] (1st floor of hospital) on Tuesday, 11/30/23 at 9:30 am. Please arrive 15 minutes prior to your appointment for registration. Make certain not to have anything to eat or drink 6 hours prior to your appointment. Should you need to reschedule your appointment, please contact radiology at [phone]. This test typically takes about 30 minutes to perform.   You have been scheduled for a colonoscopy. Please follow written instructions given to you at your visit today.   If you use inhalers (even only as needed), please bring them with you on the day of your procedure.  DO NOT TAKE 7 DAYS PRIOR TO TEST- Trulicity (dulaglutide) Ozempic, Wegovy (semaglutide) Mounjaro (tirzepatide) Bydureon Bcise (exanatide extended release)  DO NOT TAKE 1 DAY PRIOR TO YOUR TEST Rybelsus (semaglutide) Adlyxin (lixisenatide) Victoza (liraglutide) Byetta  (exanatide) ___________________________________________________________________________  BOWEL PURGE:   Purchase 1 (one) 119 GRAM bottle of Miralax Purchase 1 (one) 32 ounce bottle of Gatorade 1 box of 5mg  Dulcolax Tablets   STEPS:   Mix the entire bottle of Miralax in the 32 ounces of room temperature Gatorade and stir to dissolve completely.  Refrigerate.  Take 4 Dulcolax tablets. Wait 1 hour. Drink the Miralax solution you have prepared. You will drink this mixture over the next 2-3 hours.  You should expect results within 1 to 6 hours after completing this bowel purge.   Go to the ER if you have severe abdominal pain, can not pass gas or stool in over 12 hours, can not hold down any food.   We have sent the following medications to your pharmacy for you to pick up at your convenience: Amitiza  twice a day  First do a trial off milk/lactose products if you use them.  Add fiber like benefiber or citracel once a day Increase activity Can do trial of IBGard which is over the counter for AB pain- Take 1-2 capsules once a day for maintence or twice a day during a flare Can send in an anti spasm medication, Bentyl , to take as needed.  Thank you for trusting me with your gastrointestinal care!   Alan Coombs, PA-C   _______________________________________________________  If your blood pressure at your visit was 140/90 or greater, please contact your primary care physician to follow up on this.  _______________________________________________________  If you are age 74 or older, your body mass index should be between 23-30. Your Body mass index is 29.21 kg/m. If this is out of the  aforementioned range listed, please consider follow up with your Primary Care Provider.  If you are age 34 or younger, your body mass index should be between 19-25. Your Body mass index is 29.21 kg/m. If this is out of the aformentioned range listed, please consider follow up with your Primary Care  Provider.   ________________________________________________________  The [facility] providers would like to encourage you to use MYCHART to communicate with providers for non-urgent requests or questions.  Due to long hold times on the telephone, sending your provider a message by [facility] may be a faster and more efficient way to get a response.  Please allow 48 business hours for a response.  Please remember that this is for non-urgent requests.  _______________________________________________________  [facility] is using a team-based approach to care.  Your team is made up of your doctor and two to three APPS. Our APPS (Nurse Practitioners and Physician Assistants) work with your physician to ensure care continuity for you. They are fully qualified to address your health concerns and develop a treatment plan. They communicate directly with your gastroenterologist to care for you. Seeing the Advanced Practice Practitioners on your physician's team can help you by facilitating care more promptly, often allowing for earlier appointments, access to diagnostic testing, procedures, and other specialty referrals.      FODMAP stands for fermentable oligo-, di-, mono-saccharides and polyols (1). These are the scientific terms used to classify groups of carbs that are difficult for our body to digest and that are notorious for triggering digestive symptoms like bloating, gas, loose stools and stomach pain.   You can try low FODMAP diet  - start with eliminating just one column at a time that you feel may be a trigger for you. - the table at the very bottom contains foods that are low in FODMAPs   Sometimes trying to eliminate the FODMAP's from your diet is difficult or tricky, if you are stuggling with trying to do the elimination diet you can try an enzyme.  There is a food enzymes that you sprinkle in or on your food that helps break down the FODMAP. You can read more about the enzyme  by going to this site: https://fodzyme.com/

## 2023-11-23 ENCOUNTER — Ambulatory Visit: Payer: Self-pay | Admitting: Physician Assistant

## 2023-11-23 DIAGNOSIS — K5903 Drug induced constipation: Secondary | ICD-10-CM

## 2023-11-30 ENCOUNTER — Ambulatory Visit (HOSPITAL_COMMUNITY)

## 2023-11-30 ENCOUNTER — Ambulatory Visit (INDEPENDENT_AMBULATORY_CARE_PROVIDER_SITE_OTHER)
Admission: RE | Admit: 2023-11-30 | Discharge: 2023-11-30 | Disposition: A | Discharge status: Home / Self Care | Source: Ambulatory Visit | Attending: Physician Assistant | Admitting: Physician Assistant

## 2023-11-30 DIAGNOSIS — R1012 Left upper quadrant pain: Secondary | ICD-10-CM

## 2023-11-30 DIAGNOSIS — R110 Nausea: Secondary | ICD-10-CM | POA: Diagnosis not present

## 2023-12-04 ENCOUNTER — Ambulatory Visit (HOSPITAL_COMMUNITY)
Admission: RE | Admit: 2023-12-04 | Discharge: 2023-12-04 | Disposition: A | Discharge status: Home / Self Care | Source: Ambulatory Visit | Attending: Physician Assistant | Admitting: Physician Assistant

## 2023-12-04 DIAGNOSIS — R1012 Left upper quadrant pain: Secondary | ICD-10-CM | POA: Diagnosis present

## 2023-12-04 DIAGNOSIS — R110 Nausea: Secondary | ICD-10-CM | POA: Diagnosis present

## 2023-12-06 ENCOUNTER — Encounter: Payer: Self-pay | Admitting: Gastroenterology

## 2023-12-11 MED ORDER — NALOXEGOL OXALATE 25 MG PO TABS: 25.0000 mg | ORAL_TABLET | Freq: Every day | ORAL | 3 refills | Status: AC

## 2023-12-11 NOTE — Telephone Encounter (Signed)
 Amitiza  is not working for the patient can do trial of Movantik . Prescription sent to the patient.

## 2023-12-11 NOTE — Addendum Note (Signed)
 Addended by: CRAIG PALMA on: 12/11/2023 10:19 AM   Modules accepted: Orders

## 2023-12-14 ENCOUNTER — Encounter: Payer: Self-pay | Admitting: Gastroenterology

## 2023-12-14 ENCOUNTER — Ambulatory Visit: Admitting: Gastroenterology

## 2023-12-14 VITALS — BP 118/55 | HR 80 | Temp 98.8°F | Resp 17 | Ht 63.5 in | Wt 167.0 lb

## 2023-12-14 DIAGNOSIS — K640 First degree hemorrhoids: Secondary | ICD-10-CM

## 2023-12-14 DIAGNOSIS — R1012 Left upper quadrant pain: Secondary | ICD-10-CM

## 2023-12-14 DIAGNOSIS — R1032 Left lower quadrant pain: Secondary | ICD-10-CM

## 2023-12-14 DIAGNOSIS — K5730 Diverticulosis of large intestine without perforation or abscess without bleeding: Secondary | ICD-10-CM

## 2023-12-14 DIAGNOSIS — K5903 Drug induced constipation: Secondary | ICD-10-CM

## 2023-12-14 MED ORDER — SODIUM CHLORIDE 0.9 % IV SOLN: 500.0000 mL | INTRAVENOUS | Status: AC
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
## 2023-12-14 NOTE — Patient Instructions (Signed)
 YOU HAD AN ENDOSCOPIC PROCEDURE TODAY AT THE  ENDOSCOPY CENTER:   Refer to the procedure report that was given to you for any specific questions about what was found during the examination.  If the procedure report does not answer your questions, please call your gastroenterologist to clarify.  If you requested that your care partner not be given the details of your procedure findings, then the procedure report has been included in a sealed envelope for you to review at your convenience later.  YOU SHOULD EXPECT: Some feelings of bloating in the abdomen. Passage of more gas than usual.  Walking can help get rid of the air that was put into your GI tract during the procedure and reduce the bloating. If you had a lower endoscopy (such as a colonoscopy or flexible sigmoidoscopy) you may notice spotting of blood in your stool or on the toilet paper. If you underwent a bowel prep for your procedure, you may not have a normal bowel movement for a few days.  Please Note:  You might notice some irritation and congestion in your nose or some drainage.  This is from the oxygen used during your procedure.  There is no need for concern and it should clear up in a day or so.  SYMPTOMS TO REPORT IMMEDIATELY:  Following lower endoscopy (colonoscopy or flexible sigmoidoscopy):  Excessive amounts of blood in the stool  Significant tenderness or worsening of abdominal pains  Swelling of the abdomen that is new, acute  Fever of 100F or higher  Resume previous diet Continue present medications   For urgent or emergent issues, a gastroenterologist can be reached at any hour by calling [phone]. Do not use MyChart messaging for urgent concerns.    DIET:  We do recommend a small meal at first, but then you may proceed to your regular diet.  Drink plenty of fluids but you should avoid alcoholic beverages for 24 hours.  ACTIVITY:  You should plan to take it easy for the rest of today and you should NOT  DRIVE or use heavy machinery until tomorrow (because of the sedation medicines used during the test).    FOLLOW UP: Our staff will call the number listed on your records the next business day following your procedure.  We will call around 7:15- 8:00 am to check on you and address any questions or concerns that you may have regarding the information given to you following your procedure. If we do not reach you, we will leave a message.     If any biopsies were taken you will be contacted by phone or by letter within the next 1-3 weeks.  Please call us  at [phone] if you have not heard about the biopsies in 3 weeks.    SIGNATURES/CONFIDENTIALITY: You and/or your care partner have signed paperwork which will be entered into your electronic medical record.  These signatures attest to the fact that that the information above on your After Visit Summary has been reviewed and is understood.  Full responsibility of the confidentiality of this discharge information lies with you and/or your care-partner.

## 2023-12-14 NOTE — Op Note (Signed)
 [facility] Patient Name: Tyler Cabrera Procedure Date: 12/14/2023 8:31 AM MRN: [id number] Endoscopist: Lynnie Bring , MD, [id number] Age: 68 Referring MD:  Date of Birth: [birth year] Gender: Male Account #: [id number] Procedure:                Colonoscopy changed to flexible sigmoidoscopy, Indications:              Abdominal pain in the left lower quadrant Medicines:                Monitored Anesthesia Care Procedure:                Pre-Anesthesia Assessment:                           - Prior to the procedure, a History and Physical                            was performed, and patient medications and                            allergies were reviewed. The patient's tolerance of                            previous anesthesia was also reviewed. The risks                            and benefits of the procedure and the sedation                            options and risks were discussed with the patient.                            All questions were answered, and informed consent                            was obtained. Prior Anticoagulants: The patient has                            taken no anticoagulant or antiplatelet agents. ASA                            Grade Assessment: III - A patient with severe                            systemic disease. After reviewing the risks and                            benefits, the patient was deemed in satisfactory                            condition to undergo the procedure.                           After obtaining informed consent, the colonoscope  was passed under direct vision. Throughout the                            procedure, the patient's blood pressure, pulse, and                            oxygen saturations were monitored continuously. The                            CF [id number] #[id number] was introduced through the anus                            with the intention of advancing to the cecum. The                             scope was advanced to the splenic flexure before                            the procedure was aborted. Medications were given.                            The colonoscopy was performed without difficulty.                            The patient tolerated the procedure well. The                            quality of the bowel preparation was                            unsatisfactory. The rectum was photographed. Scope In: 9:03:33 AM Scope Out: 9:09:35 AM Total Procedure Duration: 0 hours 6 minutes 2 seconds  Findings:                 A few medium-mouthed diverticula were found in the                            sigmoid colon.                           A moderate amount of stool was found in the entire                            colon, precluding visualization. This was despite                            2-day prep                           Non-bleeding internal hemorrhoids were found during                            retroflexion. The hemorrhoids were small and Grade  I (internal hemorrhoids that do not prolapse).                           The exam was otherwise without abnormality on                            direct and retroflexion views. Complications:            No immediate complications. Estimated Blood Loss:     Estimated blood loss: none. Impression:               - Preparation of the colon was unsatisfactory.                           - Diverticulosis in the sigmoid colon.                           - Stool in the entire examined colon.                           - Non-bleeding internal hemorrhoids.                           - The examination was otherwise normal on direct                            and retroflexion views.                           - No specimens collected. Recommendation:           - Patient has a contact number available for                            emergencies. The signs and symptoms of potential                             delayed complications were discussed with the                            patient. Return to normal activities tomorrow.                            Written discharge instructions were provided to the                            patient.                           - Resume previous diet.                           - Continue present medications.                           - Ideally, would recommend repeating colonoscopy  after more extensive prep. However, due to                            limitations, other option is conservative                            management and repeat colonoscopy only if he starts                            having any new symptoms please.                           - The findings and recommendations were discussed                            with the patient's family. Lynnie Bring, MD 12/14/2023 9:14:17 AM This report has been signed electronically.

## 2023-12-14 NOTE — Progress Notes (Signed)
 Colonoscopy aborted due to poor prep  cecum not reached  reached spenic flexure.

## 2023-12-15 ENCOUNTER — Telehealth: Payer: Self-pay

## 2023-12-15 NOTE — Telephone Encounter (Signed)
  Follow up Call-     12/14/2023    8:43 AM  Call back number  Post procedure Call Back phone  # [phone]  Permission to leave phone message Yes     Patient questions:  Do you have a fever, pain , or abdominal swelling? No. Pain Score  0 *  Have you tolerated food without any problems? Yes.    Have you been able to return to your normal activities? Yes.    Do you have any questions about your discharge instructions: Diet   No. Medications  No. Follow up visit  No.  Do you have questions or concerns about your Care? No.  Actions: * If pain score is 4 or above: No action needed, pain <4.
